# Patient Record
Sex: Male | Born: 1985 | Race: Black or African American | Hispanic: No | Marital: Single | Smoking: Current every day smoker
Health system: Southern US, Community
[De-identification: ages and names within clinical notes are randomized; demographics above are authoritative.]

## PROBLEM LIST (undated history)

## (undated) DIAGNOSIS — F32A Depression, unspecified: Secondary | ICD-10-CM

## (undated) DIAGNOSIS — F329 Major depressive disorder, single episode, unspecified: Secondary | ICD-10-CM

---

## 2014-10-15 ENCOUNTER — Encounter (HOSPITAL_COMMUNITY): Payer: Self-pay | Admitting: *Deleted

## 2014-10-15 ENCOUNTER — Emergency Department (HOSPITAL_COMMUNITY): Payer: Medicaid Other

## 2014-10-15 ENCOUNTER — Emergency Department (HOSPITAL_COMMUNITY)
Admission: EM | Admit: 2014-10-15 | Discharge: 2014-10-15 | Disposition: A | Discharge status: Home / Self Care | Payer: Medicaid Other | Attending: Emergency Medicine | Admitting: Emergency Medicine

## 2014-10-15 DIAGNOSIS — S299XXA Unspecified injury of thorax, initial encounter: Secondary | ICD-10-CM | POA: Diagnosis not present

## 2014-10-15 DIAGNOSIS — S79921A Unspecified injury of right thigh, initial encounter: Secondary | ICD-10-CM | POA: Diagnosis not present

## 2014-10-15 DIAGNOSIS — Z720 Tobacco use: Secondary | ICD-10-CM | POA: Diagnosis not present

## 2014-10-15 DIAGNOSIS — Y998 Other external cause status: Secondary | ICD-10-CM | POA: Insufficient documentation

## 2014-10-15 DIAGNOSIS — Z23 Encounter for immunization: Secondary | ICD-10-CM | POA: Insufficient documentation

## 2014-10-15 DIAGNOSIS — S79922A Unspecified injury of left thigh, initial encounter: Secondary | ICD-10-CM | POA: Diagnosis not present

## 2014-10-15 DIAGNOSIS — Y9389 Activity, other specified: Secondary | ICD-10-CM | POA: Insufficient documentation

## 2014-10-15 DIAGNOSIS — S80212A Abrasion, left knee, initial encounter: Secondary | ICD-10-CM | POA: Diagnosis not present

## 2014-10-15 DIAGNOSIS — S80211A Abrasion, right knee, initial encounter: Secondary | ICD-10-CM | POA: Diagnosis not present

## 2014-10-15 DIAGNOSIS — Y92410 Unspecified street and highway as the place of occurrence of the external cause: Secondary | ICD-10-CM | POA: Diagnosis not present

## 2014-10-15 DIAGNOSIS — S01511A Laceration without foreign body of lip, initial encounter: Secondary | ICD-10-CM | POA: Diagnosis not present

## 2014-10-15 LAB — I-STAT CHEM 8, ED
BUN: 16 mg/dL (ref 6–23)
CREATININE: 0.9 mg/dL (ref 0.50–1.35)
Calcium, Ion: 1.16 mmol/L (ref 1.12–1.23)
Chloride: 101 mmol/L (ref 96–112)
GLUCOSE: 97 mg/dL (ref 70–99)
HEMATOCRIT: 44 % (ref 39.0–52.0)
Hemoglobin: 15 g/dL (ref 13.0–17.0)
Potassium: 4.4 mmol/L (ref 3.5–5.1)
Sodium: 142 mmol/L (ref 135–145)
TCO2: 26 mmol/L (ref 0–100)

## 2014-10-15 LAB — COMPREHENSIVE METABOLIC PANEL
ALBUMIN: 3.6 g/dL (ref 3.5–5.2)
ALT: 13 U/L (ref 0–53)
ANION GAP: 9 (ref 5–15)
AST: 34 U/L (ref 0–37)
Alkaline Phosphatase: 47 U/L (ref 39–117)
BILIRUBIN TOTAL: 1 mg/dL (ref 0.3–1.2)
BUN: 13 mg/dL (ref 6–23)
CO2: 25 mmol/L (ref 19–32)
Calcium: 8.8 mg/dL (ref 8.4–10.5)
Chloride: 107 mmol/L (ref 96–112)
Creatinine, Ser: 0.88 mg/dL (ref 0.50–1.35)
GFR calc Af Amer: >90 mL/min (ref >90)
GFR calc non Af Amer: >90 mL/min (ref >90)
GLUCOSE: 98 mg/dL (ref 70–99)
POTASSIUM: 4.5 mmol/L (ref 3.5–5.1)
Sodium: 141 mmol/L (ref 135–145)
TOTAL PROTEIN: 5.7 g/dL — AB (ref 6.0–8.3)

## 2014-10-15 LAB — TYPE AND SCREEN
ABO/RH(D): A POS
Antibody Screen: NEGATIVE

## 2014-10-15 LAB — CBC
HEMATOCRIT: 40.3 % (ref 39.0–52.0)
Hemoglobin: 13.5 g/dL (ref 13.0–17.0)
MCH: 30 pg (ref 26.0–34.0)
MCHC: 33.5 g/dL (ref 30.0–36.0)
MCV: 89.6 fL (ref 78.0–100.0)
Platelets: 207 10*3/uL (ref 150–400)
RBC: 4.5 MIL/uL (ref 4.22–5.81)
RDW: 14.4 % (ref 11.5–15.5)
WBC: 4.8 10*3/uL (ref 4.0–10.5)

## 2014-10-15 LAB — ABO/RH: ABO/RH(D): A POS

## 2014-10-15 LAB — ETHANOL

## 2014-10-15 LAB — PROTIME-INR
INR: 0.97 (ref 0.00–1.49)
Prothrombin Time: 13 seconds (ref 11.6–15.2)

## 2014-10-15 MED ORDER — FENTANYL CITRATE 0.05 MG/ML IJ SOLN
50.0000 ug | Freq: Once | INTRAMUSCULAR | Status: AC
  Administered 2014-10-15: 50 ug via INTRAVENOUS

## 2014-10-15 MED ORDER — FENTANYL CITRATE 0.05 MG/ML IJ SOLN
INTRAMUSCULAR | Status: AC
  Filled 2014-10-15: qty 2

## 2014-10-15 MED ORDER — TETANUS-DIPHTH-ACELL PERTUSSIS 5-2.5-18.5 LF-MCG/0.5 IM SUSP
INTRAMUSCULAR | Status: AC
  Administered 2014-10-15: 0.5 mL via INTRAMUSCULAR
  Filled 2014-10-15: qty 0.5

## 2014-10-15 MED ORDER — TETANUS-DIPHTHERIA TOXOIDS TD 5-2 LFU IM INJ: 0.5000 mL | INJECTION | Freq: Once | INTRAMUSCULAR | Status: DC

## 2014-10-15 MED ORDER — IOHEXOL 300 MG/ML  SOLN
100.0000 mL | Freq: Once | INTRAMUSCULAR | Status: AC | PRN
  Administered 2014-10-15: 100 mL via INTRAVENOUS

## 2014-10-15 MED ORDER — HYDROCODONE-ACETAMINOPHEN 5-325 MG PO TABS: 1.0000 | ORAL_TABLET | ORAL | Status: DC | PRN

## 2014-10-15 NOTE — Discharge Instructions (Signed)

## 2014-10-15 NOTE — ED Notes (Signed)
Pt given paper scrubs for transport home

## 2014-10-15 NOTE — ED Notes (Signed)
Pt given paper scrubs. 
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
## 2014-10-15 NOTE — ED Notes (Signed)
Pt tolerating liquids well, pt being driven home by his mother
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
## 2014-10-16 LAB — CDS SEROLOGY

## 2015-02-03 ENCOUNTER — Encounter (HOSPITAL_COMMUNITY): Payer: Self-pay | Admitting: Emergency Medicine

## 2015-02-03 ENCOUNTER — Emergency Department (HOSPITAL_COMMUNITY)
Admission: EM | Admit: 2015-02-03 | Discharge: 2015-02-04 | Disposition: A | Discharge status: Other Acute Inpatient Hospital | Payer: Medicaid Other | Attending: Emergency Medicine | Admitting: Emergency Medicine

## 2015-02-03 DIAGNOSIS — F419 Anxiety disorder, unspecified: Secondary | ICD-10-CM | POA: Insufficient documentation

## 2015-02-03 DIAGNOSIS — Z720 Tobacco use: Secondary | ICD-10-CM | POA: Insufficient documentation

## 2015-02-03 DIAGNOSIS — R443 Hallucinations, unspecified: Secondary | ICD-10-CM

## 2015-02-03 DIAGNOSIS — Z008 Encounter for other general examination: Secondary | ICD-10-CM | POA: Diagnosis present

## 2015-02-03 DIAGNOSIS — F1210 Cannabis abuse, uncomplicated: Secondary | ICD-10-CM | POA: Insufficient documentation

## 2015-02-03 HISTORY — DX: Major depressive disorder, single episode, unspecified: F32.9

## 2015-02-03 HISTORY — DX: Depression, unspecified: F32.A

## 2015-02-03 LAB — COMPREHENSIVE METABOLIC PANEL
ALT: 16 U/L — AB (ref 17–63)
ANION GAP: 5 (ref 5–15)
AST: 21 U/L (ref 15–41)
Albumin: 3.8 g/dL (ref 3.5–5.0)
Alkaline Phosphatase: 51 U/L (ref 38–126)
BUN: 8 mg/dL (ref 6–20)
CO2: 30 mmol/L (ref 22–32)
Calcium: 9.5 mg/dL (ref 8.9–10.3)
Chloride: 109 mmol/L (ref 101–111)
Creatinine, Ser: 0.98 mg/dL (ref 0.61–1.24)
GFR calc Af Amer: >60 mL/min (ref >60)
GFR calc non Af Amer: >60 mL/min (ref >60)
Glucose, Bld: 109 mg/dL — ABNORMAL HIGH (ref 65–99)
POTASSIUM: 4 mmol/L (ref 3.5–5.1)
Sodium: 144 mmol/L (ref 135–145)
Total Bilirubin: 0.4 mg/dL (ref 0.3–1.2)
Total Protein: 6.3 g/dL — ABNORMAL LOW (ref 6.5–8.1)

## 2015-02-03 LAB — URINALYSIS, ROUTINE W REFLEX MICROSCOPIC
BILIRUBIN URINE: NEGATIVE
Glucose, UA: NEGATIVE mg/dL
HGB URINE DIPSTICK: NEGATIVE
Ketones, ur: NEGATIVE mg/dL
Leukocytes, UA: NEGATIVE
NITRITE: NEGATIVE
PROTEIN: NEGATIVE mg/dL
Specific Gravity, Urine: 1.021 (ref 1.005–1.030)
Urobilinogen, UA: 0.2 mg/dL (ref 0.0–1.0)
pH: 7.5 (ref 5.0–8.0)

## 2015-02-03 LAB — RAPID URINE DRUG SCREEN, HOSP PERFORMED
AMPHETAMINES: NOT DETECTED
Barbiturates: NOT DETECTED
Benzodiazepines: NOT DETECTED
COCAINE: NOT DETECTED
Opiates: NOT DETECTED
TETRAHYDROCANNABINOL: POSITIVE — AB

## 2015-02-03 LAB — CBC
HCT: 40.2 % (ref 39.0–52.0)
HEMOGLOBIN: 13.4 g/dL (ref 13.0–17.0)
MCH: 30.2 pg (ref 26.0–34.0)
MCHC: 33.3 g/dL (ref 30.0–36.0)
MCV: 90.5 fL (ref 78.0–100.0)
Platelets: 201 10*3/uL (ref 150–400)
RBC: 4.44 MIL/uL (ref 4.22–5.81)
RDW: 13.6 % (ref 11.5–15.5)
WBC: 5.6 10*3/uL (ref 4.0–10.5)

## 2015-02-03 LAB — ETHANOL: Alcohol, Ethyl (B): 5 mg/dL — ABNORMAL HIGH (ref <5)

## 2015-02-03 MED ORDER — ACETAMINOPHEN 325 MG PO TABS
650.0000 mg | ORAL_TABLET | ORAL | Status: DC | PRN
Start: 2015-02-03 — End: 2015-02-04

## 2015-02-03 MED ORDER — ALUM & MAG HYDROXIDE-SIMETH 200-200-20 MG/5ML PO SUSP: 30.0000 mL | ORAL | Status: DC | PRN

## 2015-02-03 MED ORDER — IBUPROFEN 200 MG PO TABS: 600.0000 mg | ORAL_TABLET | Freq: Three times a day (TID) | ORAL | Status: DC | PRN

## 2015-02-03 MED ORDER — LORAZEPAM 1 MG PO TABS: 1.0000 mg | ORAL_TABLET | Freq: Three times a day (TID) | ORAL | Status: DC | PRN

## 2015-02-03 MED ORDER — ZOLPIDEM TARTRATE 5 MG PO TABS: 5.0000 mg | ORAL_TABLET | Freq: Every evening | ORAL | Status: DC | PRN

## 2015-02-03 MED ORDER — ONDANSETRON HCL 4 MG PO TABS: 4.0000 mg | ORAL_TABLET | Freq: Three times a day (TID) | ORAL | Status: DC | PRN

## 2015-02-03 NOTE — ED Provider Notes (Signed)
CSN: [id number]     Arrival date & time 02/03/15  1316 History   First MD Initiated Contact with Patient 02/03/15 1600     Chief Complaint  Patient presents with  . Homicidal  . Psychiatric Evaluation     (Consider location/radiation/quality/duration/timing/severity/associated sxs/prior Treatment) HPI patient presents with concern of new auditory hallucinations, homicidal ideation. He acknowledges a history of anxiety, states that in the past he has had similar auditory hallucination, but has not heard voices in a long time. Over the past days, the patient's voices have become dominant, telling him to perform both inconsequential, and violent actions. He denies medical problems, physical pain, any discomfort. He takes no medication currently. Has previously seen a psychiatrist, but not currently.      History reviewed. No pertinent past medical history. History reviewed. No pertinent past surgical history. No family history on file. History  Substance Use Topics  . Smoking status: Current Every Day Smoker  . Smokeless tobacco: Not on file  . Alcohol Use: No    Review of Systems  Constitutional:       Per HPI, otherwise negative  HENT:       Per HPI, otherwise negative  Respiratory:       Per HPI, otherwise negative  Cardiovascular:       Per HPI, otherwise negative  Gastrointestinal: Negative for vomiting.  Endocrine:       Negative aside from HPI  Genitourinary:       Neg aside from HPI   Musculoskeletal:       Per HPI, otherwise negative  Skin: Negative.   Neurological: Negative for syncope.  Psychiatric/Behavioral: Positive for hallucinations, sleep disturbance and dysphoric mood. The patient is nervous/anxious and is hyperactive.       Allergies  Review of patient's allergies indicates no known allergies.  Home Medications   Prior to Admission medications   Medication Sig Start Date End Date Taking? Authorizing Provider  HYDROcodone-acetaminophen  (NORCO/VICODIN) 5-325 MG per tablet Take 1 tablet by mouth every 4 (four) hours as needed. 10/15/14   Silas Flood, MD   BP 116/67 mmHg  Pulse 102  Temp(Src) 98.5 F (36.9 C) (Oral)  Resp 20  SpO2 98% Physical Exam  Constitutional: He is oriented to person, place, and time. He appears well-developed. No distress.  HENT:  Head: Normocephalic and atraumatic.  Eyes: Conjunctivae and EOM are normal.  Cardiovascular: Normal rate and regular rhythm.   Pulmonary/Chest: Effort normal. No stridor. No respiratory distress.  Abdominal: He exhibits no distension.  Musculoskeletal: He exhibits no edema.  Neurological: He is alert and oriented to person, place, and time.  Skin: Skin is warm and dry.  Psychiatric: His mood appears anxious. He expresses homicidal ideation. He expresses no suicidal plans and no homicidal plans.  Nursing note and vitals reviewed.   ED Course  Procedures (including critical care time)   I saw the ECG, relevant labs and studies - I agree with the interpretation.   MDM  Patient presents with concern of auditory hallucinations, homicidal ideation. Patient is awake, alert, afebrile, in no distress. Patient's labs unremarkable, patient cleared for psychiatric evaluation.  Gerhard Munch, MD 02/04/15 Jacinta Shoe
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
## 2015-02-03 NOTE — Progress Notes (Addendum)
Patient has been referred to the following facilities for IP psych treatment: [facility], [facility], [facility], [facility], and [facility].  At capacity: [facility]   CSW will continue to seek placement.  Melbourne Abts, LCSWA Disposition staff 02/03/2015 9:01 PM
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
## 2015-02-03 NOTE — ED Notes (Signed)
Staffing made aware states sitter is on the way.

## 2015-02-03 NOTE — ED Notes (Signed)
Sitter at bedside.

## 2015-02-03 NOTE — ED Notes (Signed)
TTS being performed.  

## 2015-02-04 ENCOUNTER — Encounter (HOSPITAL_COMMUNITY): Payer: Self-pay | Admitting: Behavioral Health

## 2015-02-04 ENCOUNTER — Inpatient Hospital Stay (HOSPITAL_COMMUNITY)
Admission: AD | Admit: 2015-02-04 | Discharge: 2015-02-10 | DRG: 885 | Disposition: A | Discharge status: Home / Self Care | Payer: Medicaid Other | Source: Intra-hospital | Attending: Psychiatry | Admitting: Psychiatry

## 2015-02-04 DIAGNOSIS — G4700 Insomnia, unspecified: Secondary | ICD-10-CM | POA: Diagnosis present

## 2015-02-04 DIAGNOSIS — F333 Major depressive disorder, recurrent, severe with psychotic symptoms: Secondary | ICD-10-CM | POA: Diagnosis present

## 2015-02-04 DIAGNOSIS — F17210 Nicotine dependence, cigarettes, uncomplicated: Secondary | ICD-10-CM | POA: Diagnosis present

## 2015-02-04 DIAGNOSIS — R45851 Suicidal ideations: Secondary | ICD-10-CM | POA: Diagnosis present

## 2015-02-04 MED ORDER — PNEUMOCOCCAL VAC POLYVALENT 25 MCG/0.5ML IJ INJ
0.5000 mL | INJECTION | INTRAMUSCULAR | Status: AC
  Administered 2015-02-05: 0.5 mL via INTRAMUSCULAR

## 2015-02-04 MED ORDER — ACETAMINOPHEN 325 MG PO TABS: 650.0000 mg | ORAL_TABLET | Freq: Four times a day (QID) | ORAL | Status: DC | PRN

## 2015-02-04 MED ORDER — TRAZODONE HCL 50 MG PO TABS
50.0000 mg | ORAL_TABLET | Freq: Every evening | ORAL | Status: DC | PRN
  Administered 2015-02-05 – 2015-02-09 (×5): 50 mg via ORAL
  Filled 2015-02-04: qty 1
  Filled 2015-02-04: qty 14
  Filled 2015-02-04 (×4): qty 1

## 2015-02-04 MED ORDER — ALUM & MAG HYDROXIDE-SIMETH 200-200-20 MG/5ML PO SUSP: 30.0000 mL | ORAL | Status: DC | PRN

## 2015-02-04 MED ORDER — MAGNESIUM HYDROXIDE 400 MG/5ML PO SUSP: 30.0000 mL | Freq: Every day | ORAL | Status: DC | PRN

## 2015-02-04 MED ORDER — OLANZAPINE 5 MG PO TBDP: 5.0000 mg | ORAL_TABLET | Freq: Three times a day (TID) | ORAL | Status: DC | PRN

## 2015-02-04 MED ORDER — HYDROXYZINE HCL 25 MG PO TABS
25.0000 mg | ORAL_TABLET | Freq: Four times a day (QID) | ORAL | Status: DC | PRN
  Administered 2015-02-04 – 2015-02-06 (×2): 25 mg via ORAL
  Filled 2015-02-04 (×2): qty 1

## 2015-02-04 MED ORDER — FLUOXETINE HCL 20 MG PO CAPS
20.0000 mg | ORAL_CAPSULE | Freq: Every day | ORAL | Status: DC
  Administered 2015-02-04 – 2015-02-10 (×7): 20 mg via ORAL
  Filled 2015-02-04 (×10): qty 1

## 2015-02-04 MED ORDER — RISPERIDONE 1 MG PO TABS
1.0000 mg | ORAL_TABLET | Freq: Two times a day (BID) | ORAL | Status: DC
  Administered 2015-02-04 – 2015-02-07 (×6): 1 mg via ORAL
  Filled 2015-02-04 (×9): qty 1

## 2015-02-04 MED ORDER — NICOTINE 21 MG/24HR TD PT24
21.0000 mg | MEDICATED_PATCH | Freq: Every day | TRANSDERMAL | Status: DC
  Filled 2015-02-04 (×2): qty 1

## 2015-02-04 MED ORDER — NICOTINE 21 MG/24HR TD PT24
21.0000 mg | MEDICATED_PATCH | Freq: Every day | TRANSDERMAL | Status: DC
  Administered 2015-02-04 – 2015-02-10 (×7): 21 mg via TRANSDERMAL
  Filled 2015-02-04 (×10): qty 1

## 2015-02-04 NOTE — Progress Notes (Signed)
EKG complete, showed to MD, and placed on front of chart.

## 2015-02-04 NOTE — ED Notes (Signed)
Pt. Used one of his 5 min phone calls to call his work and tell them he will not be there

## 2015-02-04 NOTE — Tx Team (Addendum)
Initial Interdisciplinary Treatment Plan   PATIENT STRESSORS: Medication change or noncompliance   PATIENT STRENGTHS: Ability for insight Average or above average intelligence Motivation for treatment/growth   PROBLEM LIST: Problem List/Patient Goals Date to be addressed Date deferred Reason deferred Estimated date of resolution  "Auditory  Hallucinations" 02/04/15     "Anxiety"  02/04/15     "Learn to interact with others" 02/04/15                                          DISCHARGE CRITERIA:  Ability to meet basic life and health needs Adequate post-discharge living arrangements Improved stabilization in mood, thinking, and/or behavior Withdrawal symptoms are absent or subacute and managed without 24-hour nursing intervention  PRELIMINARY DISCHARGE PLAN: Attend aftercare/continuing care group Attend PHP/IOP  PATIENT/FAMIILY INVOLVEMENT: This treatment plan has been presented to and reviewed with the patient, Joe Griffin, and/or family member.  The patient and family have been given the opportunity to ask questions and make suggestions.  Joe Griffin L 02/04/2015, 1:23 PM

## 2015-02-04 NOTE — Progress Notes (Signed)
Adult Psychoeducational Group Note  Date:  02/04/2015 Time:  11:43 PM  Group Topic/Focus:  Wrap-Up Group:   The focus of this group is to help patients review their daily goal of treatment and discuss progress on daily workbooks.  Participation Level:  Active  Participation Quality:  Appropriate and Attentive  Affect:  Appropriate  Cognitive:  Appropriate  Insight: Appropriate  Engagement in Group:  Engaged  Modes of Intervention:  Discussion and Education  Additional Comments:  Pt rated his day at an 8 out of 10. Pt shared that it was his first day here and staff has been really helpful to him. Pt stated he really misses his family and doesn't want to hurt anyone he cares about.   Malachy Moan 02/04/2015, 11:43 PM

## 2015-02-04 NOTE — Progress Notes (Signed)
Patient presents in the dayroom. Increased anxiety noted, taping feet. He reports he is in the hospital because he "got too stressed." He reports that he was working two jobs, started hearing voices at night and seeing things that weren't there. "I could not take it any more, I have been off my medication for a year." He denies SI. He denies HI at the time, but reports "I did want to hurt someone who I thought did me wrong, but scared to get in trouble." He denies physical pain. Will continue to monitor on special checks q 15 mins for safety.

## 2015-02-04 NOTE — ED Notes (Signed)
Breakfast tray received.

## 2015-02-04 NOTE — Progress Notes (Signed)
Pt accepted to [facility] bed 504-2 per Julieanne Cotton to Dr. Elna Breslow. Admission is voluntary. Pt can be transported when ready.  Spoke with [facility] RN re: pt's placement.   Ilean Skill, MSW, LCSWA Clinical Social Work, Disposition  02/04/2015 [phone]

## 2015-02-04 NOTE — Progress Notes (Signed)
D   Pt has been isolative and resting in bed   He endorses A/V hallucinations and some depression with anxiety   He is not interacting with others at this point but he has been cooperative A   Verbal support given   Medications offered and educated on same   Q 15 min checks R   Pt safe at present

## 2015-02-05 ENCOUNTER — Encounter (HOSPITAL_COMMUNITY): Payer: Self-pay | Admitting: Psychiatry

## 2015-02-05 MED ORDER — ENSURE ENLIVE PO LIQD
237.0000 mL | Freq: Every morning | ORAL | Status: DC
  Administered 2015-02-05 – 2015-02-10 (×5): 237 mL via ORAL

## 2015-02-05 MED ORDER — BENZTROPINE MESYLATE 0.5 MG PO TABS
0.5000 mg | ORAL_TABLET | Freq: Two times a day (BID) | ORAL | Status: DC
  Administered 2015-02-05 – 2015-02-10 (×10): 0.5 mg via ORAL
  Filled 2015-02-05 (×18): qty 1

## 2015-02-05 NOTE — Progress Notes (Signed)
NUTRITION ASSESSMENT  Pt identified as at risk on the Malnutrition Screen Tool  INTERVENTION: 1. Educated patient on the importance of nutrition and encouraged intake of food and beverages. 2. Discussed weight goals. 3. Supplements: will order Ensure Enlive once/day, this supplement provides 350 kcal and 20 grams of protein   NUTRITION DIAGNOSIS: Unintentional weight loss related to sub-optimal intake as evidenced by pt report.   Goal: Pt to meet >/= 90% of their estimated nutrition needs.  Monitor:  PO intake  Assessment:  Pt seen for MST. Appetite decreased PTA and weight hx reveals 13 lb weight loss (7% body weight) in 3.5 months which is not significant for time frame. Will order Ensure Enlive once/day to supplement.  29 y.o. male  Height: Ht Readings from Last 1 Encounters:  02/04/15 6' (1.829 m)    Weight: Wt Readings from Last 1 Encounters:  02/04/15 165 lb (74.844 kg)    Weight Hx: Wt Readings from Last 10 Encounters:  02/04/15 165 lb (74.844 kg)  10/15/14 78 lb (35.381 kg)    BMI:  Body mass index is 22.37 kg/(m^2). Pt meets criteria for normal weight based on current BMI.  Estimated Nutritional Needs: Kcal: 25-30 kcal/kg Protein: > 1 gram protein/kg Fluid: 1 ml/kcal  Diet Order: Diet regular Room service appropriate?: Yes; Fluid consistency:: Thin Pt is also offered choice of unit snacks mid-morning and mid-afternoon.  Pt is eating as desired.   Lab results and medications reviewed.      Trenton Gammon, RD, LDN Inpatient Clinical Dietitian Pager # [phone] After hours/weekend pager # [phone]
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
## 2015-02-05 NOTE — Tx Team (Signed)
Interdisciplinary Treatment Plan Update (Adult)  Date:  02/05/2015   Time Reviewed:  10:48 AM   Progress in Treatment: Attending groups: Yes. Participating in groups:  Yes. Taking medication as prescribed:  Yes. Tolerating medication:  Yes. Family/Significant othe contact made:  No Patient understands diagnosis:  Yes  As evidenced by seeking help with psychosis, depression Discussing patient identified problems/goals with staff:  Yes, see initial care plan. Medical problems stabilized or resolved:  Yes. Denies suicidal/homicidal ideation: Yes. Issues/concerns per patient self-inventory:  No. Other:  New problem(s) identified:  Discharge Plan or Barriers:  Return home, follow up outpt  Reason for Continuation of Hospitalization: Depression Hallucinations Medication stabilization  Comments:  Joe Griffin is an 29 y.o. male who presents to [facility] for evaluation of depression and AVH. He reports a history of both and states he was hospitalized at [facility] in 2012 and has not had psychiatric medication or treatment since moving to [place] in may of last year. He reports he's had some auditory hallucinations in the past, but that he could only hear them when he was silent. Now they have gotten to the point where he cannot shut them out. He states that he's only slept 3 hours in the last two days and that he's lost around 14lbs. He reports feeling depressed, anxious, irritable, tired, and endorses isolating behavior, anhedonia, feelings of worthlessness, and increased vegetative symptoms. He is calm, cooperative, and alert and oriented x 4.  Risperdal, Prozac trial  Estimated length of stay: 3-5 days  New goal(s):  Review of initial/current patient goals per problem list:   Review of initial/current patient goals per problem list:  1. Goal(s): Patient will participate in aftercare plan   Met: Yes   Target date: 3-5 days post admission date   As evidenced by: Patient  will participate within aftercare plan AEB aftercare provider and housing plan at discharge being identified.  02/05/2015: Pt plans to return home, follow up outpt.     3. Goal(s): Patient will demonstrate decreased signs and symptoms of anxiety.   Met: No   Target date: 3-5 days post admission date   As evidenced by: Patient will utilize self rating of anxiety at 3 or below and demonstrated decreased signs of anxiety, or be deemed stable for discharge by MD  02/05/2015: Pt rates anxiety at a 5 today.    5. Goal(s): Patient will demonstrate decreased signs of psychosis  * Met: No  * Target date: 3-5 days post admission date  * As evidenced by: Patient will demonstrate decreased frequency of AVH or return to baseline function  02/05/2015: While compliant with meds here, pt has not taken meds for about a year.  Was c/o both AH and VH at admission.       Attendees: Patient:  02/05/2015 10:48 AM   Family:   02/05/2015 10:48 AM   Physician:  Neita Garnet, MD 02/05/2015 10:48 AM   Nursing:   Festus Aloe, RN 02/05/2015 10:48 AM   CSW:    Roque Lias, LCSW   02/05/2015 10:48 AM   Other:  02/05/2015 10:48 AM   Other:   02/05/2015 10:48 AM   Other:  Lars Pinks, Nurse CM 02/05/2015 10:48 AM   Other:  Lucinda Dell, [facility] 02/05/2015 10:48 AM   Other:  Norberto Sorenson, [place]  02/05/2015 10:48 AM   Other:  02/05/2015 10:48 AM   Other:  02/05/2015 10:48 AM   Other:  02/05/2015 10:48 AM   Other:  02/05/2015 10:48 AM  Other:  02/05/2015 10:48 AM   Other:   02/05/2015 10:48 AM    Scribe for Treatment Team:   Trish Mage, 02/05/2015 10:48 AM
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
## 2015-02-05 NOTE — Progress Notes (Signed)
D) Pt has been animated and pleasant. Positive for groups and activities with minimal prompting. Pt is active in the milieu and interacting appropriately with peers and staff. At times affect can be a bit incongruent. Pt shared that he has been working two jobs, and going to school for Counsellor. 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 Pt positive for auditory hallucinations when still and unit quiet he says. Camille says when its noisy he can tune them out. A) Level 3 obs for safety, support and encouragement provided. Med ed reinforced. pneumonia vaccine given per MD order. R) receptive.

## 2015-02-05 NOTE — [facility] Group Notes (Signed)
[facility] LCSW Aftercare Discharge Planning Group Note   02/05/2015 11:01 AM  Participation Quality:  Engaged  Mood/Affect:  Appropriate  Depression Rating:  denies  Anxiety Rating:  5  Thoughts of Suicide:  No Will you contract for safety?   NA  Current AVH:  "Not right now.  But it usually doesn't bother me when I am around other people."  Plan for Discharge/Comments:  Good mood.  Happy that he is back on his meds again.  Slept well.  Appetite good.   Transportation Means: brother  Supports: brother  [place], Baldo Daub
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
## 2015-02-05 NOTE — [facility] Group Notes (Signed)
[facility] LCSW Group Therapy  02/05/2015 3:29 PM   Type of Therapy:  Group Therapy  Participation Level:  Active  Participation Quality:  Attentive  Affect:  Appropriate  Cognitive:  Appropriate  Insight:  Improving  Engagement in Therapy:  Engaged  Modes of Intervention:  Clarification, Education, Exploration and Socialization  Summary of Progress/Problems: Today's group focused on relapse prevention.  We defined the term, and then brainstormed on ways to prevent relapse. Rafi was engaged throughout.  No signs nor symptoms of psychosis.  Able to attend to subject, contribute appropriately and is goal directed.  Good insight.  Gave positive feedback and encouragement to others.  Daryel Gerald B 02/05/2015 , 3:29 PM

## 2015-02-06 DIAGNOSIS — F333 Major depressive disorder, recurrent, severe with psychotic symptoms: Principal | ICD-10-CM

## 2015-02-06 LAB — LIPID PANEL
Cholesterol: 214 mg/dL — ABNORMAL HIGH (ref 0–200)
HDL: 49 mg/dL (ref >40)
LDL CALC: 150 mg/dL — AB (ref 0–99)
TRIGLYCERIDES: 75 mg/dL (ref <150)
Total CHOL/HDL Ratio: 4.4 RATIO
VLDL: 15 mg/dL (ref 0–40)

## 2015-02-06 NOTE — Progress Notes (Signed)
Patient ID: Joe Griffin, male   DOB: 06/18/1986, 29 y.o.   MRN: [id number] D: Client visible on the unit in dayroom watching TV, smiling, interacts appropriately reports when asked about his day "so far so good, but nothing I couldn't handle"  Client reports using coping skill to deal with voices "distraction, talking, reading, try videos" Client reports admission was due to not taking medications "I was working to hard" Client reports admission was helpful "made me realize I need my medications" Client plans for discharge "get my medicine before I run out" A: Writer encouraged client to move forward with positive plans to stay on medications and report any side effects. Staff will monitor q35min for safety. R: Client is safe on the unit, attended karaoke and sang.

## 2015-02-06 NOTE — Progress Notes (Addendum)
[facility] MD Progress Note  02/06/2015 3:23 PM Joe Griffin  MRN:  [id number] Subjective:  Patient is a 29 year old male diagnosed with major depressive disorder recurrent severe with psychotic features  Patient reports that he's been sleeping excessively because of his medication, adds that he is no longer hearing voices. Patient however seems to be responding to internal stimuli, smiling inappropriately  Patient reports that he still is struggling with his thinking, has problems with anger, gets irritated easily. He states that he wants to get better. He has that he misses his grandfather, is sad about it but knows that his grandfather is in the better place. Patient states that he would like to be tested for STDs though he denies any symptoms such as itching, discharge or redness  Patient denies any side effects of the medications, any thoughts of hurting himself or others. Principal Problem: MDD (major depressive disorder), recurrent, severe, with psychosis Diagnosis:   Patient Active Problem List   Diagnosis Date Noted  . MDD (major depressive disorder), recurrent, severe, with psychosis [F33.3] 02/04/2015   Total Time spent with patient: 30 minutes   Past Medical History:  Past Medical History  Diagnosis Date  . Depression    History reviewed. No pertinent past surgical history. Family History: History reviewed. No pertinent family history. Social History:  History  Alcohol Use No     History  Drug Use Not on file    History   Social History  . Marital Status: Single    Spouse Name: N/A  . Number of Children: N/A  . Years of Education: N/A   Social History Main Topics  . Smoking status: Current Every Day Smoker  . Smokeless tobacco: Not on file  . Alcohol Use: No  . Drug Use: Not on file  . Sexual Activity: Not on file   Other Topics Concern  . None   Social History Narrative   Additional History:    Sleep: Fair  Appetite:  Fair   Assessment:    Musculoskeletal: Strength & Muscle Tone: within normal limits Gait & Station: normal Patient leans: N/A   Psychiatric Specialty Exam: Physical Exam  Review of Systems  Constitutional: Negative.  Negative for fever, chills, weight loss and malaise/fatigue.  HENT: Negative.  Negative for congestion, hearing loss and sore throat.   Eyes: Negative.  Negative for blurred vision and double vision.  Respiratory: Negative.  Negative for cough and hemoptysis.   Cardiovascular: Negative for chest pain and palpitations.  Gastrointestinal: Negative.  Negative for heartburn, nausea, vomiting, abdominal pain and constipation.  Genitourinary: Negative.  Negative for dysuria.  Musculoskeletal: Negative.  Negative for myalgias and falls.  Skin: Negative.  Negative for rash.  Neurological: Negative.  Negative for dizziness, seizures, loss of consciousness, weakness and headaches.  Endo/Heme/Allergies: Negative.  Negative for environmental allergies.  Psychiatric/Behavioral: Positive for depression and hallucinations. Negative for suicidal ideas, memory loss and substance abuse. The patient is not nervous/anxious and does not have insomnia.     Blood pressure 102/61, pulse 105, temperature 98.9 F (37.2 C), temperature source Oral, resp. rate 18, height 6' (1.829 m), weight 74.844 kg (165 lb).Body mass index is 22.37 kg/(m^2).  General Appearance: Casual and Disheveled  Eye Contact::  Fair  Speech:  Clear and Coherent and Normal Rate  Volume:  Decreased  Mood:  Depressed and Dysphoric  Affect:  Non-Congruent and Inappropriate  Thought Process:  Coherent and Disorganized  Orientation:  Full (Time, Place, and Person)  Thought Content:  Hallucinations: Auditory  Suicidal Thoughts:  No  Homicidal Thoughts:  No  Memory:  Immediate;   Fair Recent;   Fair Remote;   Fair  Judgement:  Poor  Insight:  Lacking  Psychomotor Activity:  Mannerisms  Concentration:  Poor  Recall:  Poor  Fund of  Knowledge:Fair  Language: Fair  Akathisia:  No  Handed:  Right  AIMS (if indicated):     Assets:  Desire for Improvement Social Support  ADL's:  Impaired  Cognition: Impaired,  Mild  Sleep:  Number of Hours: 5.25     Current Medications: Current Facility-Administered Medications  Medication Dose Route Frequency Provider Last Rate Last Dose  . acetaminophen (TYLENOL) tablet 650 mg  650 mg Oral Q6H PRN Thermon Leyland, NP      . alum & mag hydroxide-simeth (MAALOX/MYLANTA) 200-200-20 MG/5ML suspension 30 mL  30 mL Oral Q4H PRN Thermon Leyland, NP      . benztropine (COGENTIN) tablet 0.5 mg  0.5 mg Oral BID Beau Fanny, FNP   0.5 mg at 02/06/15 9604  . feeding supplement (ENSURE ENLIVE) (ENSURE ENLIVE) liquid 237 mL  237 mL Oral q morning - 10a Anderson [place], RD   237 mL at 02/05/15 1010  . FLUoxetine (PROZAC) capsule 20 mg  20 mg Oral Daily Sanjuana Kava, NP   20 mg at 02/06/15 0811  . hydrOXYzine (ATARAX/VISTARIL) tablet 25 mg  25 mg Oral Q6H PRN Thermon Leyland, NP   25 mg at 02/04/15 1836  . magnesium hydroxide (MILK OF MAGNESIA) suspension 30 mL  30 mL Oral Daily PRN Thermon Leyland, NP      . nicotine (NICODERM CQ - dosed in mg/24 hours) patch 21 mg  21 mg Transdermal Daily Craige Cotta, MD   21 mg at 02/06/15 5409  . OLANZapine zydis (ZYPREXA) disintegrating tablet 5 mg  5 mg Oral Q8H PRN Thermon Leyland, NP      . risperiDONE (RISPERDAL) tablet 1 mg  1 mg Oral BID Sanjuana Kava, NP   1 mg at 02/06/15 8119  . traZODone (DESYREL) tablet 50 mg  50 mg Oral QHS PRN Thermon Leyland, NP   50 mg at 02/05/15 2318    Lab Results:  Results for orders placed or performed during the hospital encounter of 02/04/15 (from the past 48 hour(s))  Lipid panel     Status: Abnormal   Collection Time: 02/06/15  6:33 AM  Result Value Ref Range   Cholesterol 214 (H) 0 - 200 mg/dL   Triglycerides 75 <147 mg/dL   HDL 49 >82 mg/dL   Total CHOL/HDL Ratio 4.4 RATIO   VLDL 15 0 - 40 mg/dL   LDL  Cholesterol 956 (H) 0 - 99 mg/dL    Comment:        Total Cholesterol/HDL:CHD Risk Coronary Heart Disease Risk Table                     Men   Women  1/2 Average Risk   3.4   3.3  Average Risk       5.0   4.4  2 X Average Risk   9.6   7.1  3 X Average Risk  23.4   11.0        Use the calculated Patient Ratio above and the CHD Risk Table to determine the patient's CHD Risk.        ATP III CLASSIFICATION (LDL):  <100  mg/dL   Optimal  696-295  mg/dL   Near or Above                    Optimal  130-159  mg/dL   Borderline  284-132  mg/dL   High  >440     mg/dL   Very High Performed at [facility]     Physical Findings: AIMS: Facial and Oral Movements Muscles of Facial Expression: None, normal Lips and Perioral Area: None, normal Jaw: None, normal Tongue: None, normal,Extremity Movements Upper (arms, wrists, hands, fingers): None, normal Lower (legs, knees, ankles, toes): None, normal, Trunk Movements Neck, shoulders, hips: None, normal, Overall Severity Severity of abnormal movements (highest score from questions above): None, normal Incapacitation due to abnormal movements: None, normal Patient's awareness of abnormal movements (rate only patient's report): No Awareness, Dental Status Current problems with teeth and/or dentures?: No Does patient usually wear dentures?: No  CIWA:    COWS:     Treatment Plan Summary: Daily contact with patient to assess and evaluate symptoms and progress in treatment and Medication management Continue Prozac 20 mg daily for major depressive disorder Continue risperidone 1 mg twice daily for psychosis and also to help with the depression Continue Cogentin 0.5 mg twice daily for EPS Continue nicotine patch for smoking cessation. Discussed smoking cessation in length with patient and patient states that he's not willing to stop smoking and does not want any treatment for it Continue Vistaril 25 mg every 6 hours when necessary  anxiety or agitation Continue trazodone 50 mg at bedtime as needed for sleep Continue to participate in groups and therapeutic milieu       Sorah Falkenstein 02/06/2015, 3:23 PM

## 2015-02-06 NOTE — Plan of Care (Signed)
Problem: Alteration in thought process Goal: STG-Patient does not respond to command hallucinations Outcome: Progressing Pt alert and oriented to self, place, time and situation. No response to internal stimuli noted thus far this shift. Pt able to communicate logically, cooperative with unit routines and remains compliant with medications as prescribed.

## 2015-02-06 NOTE — Progress Notes (Signed)
D: Pt visible in milieu for majority of this shift. Pt presents with congruent affect and euthymic mood. Rated his depression 2/10, hopelessness 2/10 and anxiety 3/10 on self inventory sheet. Pt's goal for today is "getting out of her tomorrow" which he plans to attain by "going to groups, take my meds, speak with my doctor". Pt attended scheduled groups. Observed interacting well with peers and staff. Logical and coherent in thoughts.  A: Verbal education done on ordered medications prior to administration. 1:1 contact made with pt to conduct assessments and evaluate needs. Pt encouraged to voice concerns.Emotional support and availability offered. Safety maintained on Q 15 minutes checks without behavioral issues to note at this time.  R: Pt receptive to care. Denies SI, HI, AVH and pain when assessed. Cooperative with unit routines. Compliant with medications as prescribed. Denies adverse drug reactions. Remains safe on and off unit.

## 2015-02-06 NOTE — [facility] Group Notes (Signed)
[facility] Group Notes:  (Counselor/Nursing/MHT/Case Management/Adjunct)  02/06/2015 1:15PM  Type of Therapy:  Group Therapy  Participation Level:  Active  Participation Quality:  Appropriate  Affect:  Flat  Cognitive:  Oriented  Insight:  Improving  Engagement in Group:  Limited  Engagement in Therapy:  Limited  Modes of Intervention:  Discussion, Exploration and Socialization  Summary of Progress/Problems: The topic for group was balance in life.  Pt participated in the discussion about when their life was in balance and out of balance and how this feels.  Pt discussed ways to get back in balance and short term goals they can work on to get where they want to be. Engaged throughout.  States he is balanced today, and knows this because he is sleeping much better and is not feeling depressed.  When asked about this, stated his grandfather died 3 weeks ago, and that has been difficult.  We talked about grief and loss, and how that is an unbalancing event.  He identified exercise as a good way of getting back in balance, as well as taking care of personal hygiene and taking meds.   Daryel Gerald B 02/06/2015 4:07 PM

## 2015-02-06 NOTE — [facility] Group Notes (Addendum)
[facility] Group Notes:  (Nursing---Leisure and Lifestyle Changes)  Date:  02/06/2015  Time:  0930  Type of Therapy:  Psychoeducational Skills  Participation Level:  Active  Participation Quality:  Appropriate, Attentive, Sharing and Supportive  Affect:  Appropriate  Cognitive:  Alert and Oriented   Insight:  Good  Engagement in Group:  Engaged and Supportive  Modes of Intervention:  Discussion, Education, Orientation, Rapport Building and Support  Summary of Progress/Problems: Pt presents with calm mood and bright affect. Pt was engaged in group and stayed on topic.   Ouida Sills, Lincoln Maxin 02/06/2015, 0930

## 2015-02-06 NOTE — Progress Notes (Signed)
D: Patient alert and oriented x 4. Patient denies pain SI/HI/AVH.  A: Staff to monitor Q 15 mins for safety. Encouragement and support offered. Scheduled medications administered per orders. R: Patient remains safe on the unit. Patient attended group tonight. Patient visible on hte unit and interacting with peers. Patient taking administered medications.  

## 2015-02-07 LAB — HEMOGLOBIN A1C
HEMOGLOBIN A1C: 5.9 % — AB (ref 4.8–5.6)
Mean Plasma Glucose: 123 mg/dL

## 2015-02-07 LAB — RPR: RPR: NONREACTIVE

## 2015-02-07 LAB — GC/CHLAMYDIA PROBE AMP (~~LOC~~) NOT AT ARMC
Chlamydia: NEGATIVE
Neisseria Gonorrhea: NEGATIVE

## 2015-02-07 MED ORDER — LORAZEPAM 1 MG PO TABS: 1.0000 mg | ORAL_TABLET | Freq: Four times a day (QID) | ORAL | Status: DC | PRN

## 2015-02-07 MED ORDER — RISPERIDONE 2 MG PO TABS
2.0000 mg | ORAL_TABLET | Freq: Every day | ORAL | Status: DC
  Administered 2015-02-07 – 2015-02-08 (×2): 2 mg via ORAL
  Filled 2015-02-07 (×4): qty 1

## 2015-02-07 MED ORDER — RISPERIDONE 1 MG PO TABS
1.0000 mg | ORAL_TABLET | Freq: Every day | ORAL | Status: DC
  Administered 2015-02-08 – 2015-02-09 (×2): 1 mg via ORAL
  Filled 2015-02-07 (×4): qty 1

## 2015-02-07 NOTE — Progress Notes (Signed)
The focus of this group is to help patients review their daily goal of treatment and discuss progress on daily workbooks. Pt attended the evening group session and responded to all discussion prompts from the Writer. 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 "I feel so much better knowing I still have my job. I know I need to focus on me right now, but bills and rent don't just go away." Pt's only additional request from Nursing Staff this evening was to be allowed to shave, which he did following wrap-up. Pt's affect was appropriate and he volunteered several encouraging comments to his peers during group.

## 2015-02-07 NOTE — Progress Notes (Addendum)
Pt is very pleasant and cooperative. He appears to be responding to internal stimuli evident by him smiling all the time and laughing inappropriately. Pt wants to remain on his meds . He rates his depression a 0/10 and his anxiety a 2./10 today. Pt has been attending group. 3:30p_Pt tried to call Nap[place] with [employer]. The company that subcontracts is: [employer]-Pt contacted Noreene Larsson at [employer] to see if she could help him with his living situation. Her number is: [phone]. Pt is very friendly and told the writer he one day would like to meet a nice girl and have a family. He stated he always tries to take care of his younger siblings. Pt is in the dayroom with the other pts. He would like to leave so he can go make money and pay his apt rent.

## 2015-02-07 NOTE — Plan of Care (Signed)
Problem: Alteration in thought process Goal: LTG-Patient verbalizes understanding importance med regimen (Patient verbalizes understanding of importance of medication regimen and need to continue outpatient care.)  Outcome: Progressing Client reports importance of medications AEB admission has been helpful "made me realize I need my medicine" client has been compliant with medications.

## 2015-02-07 NOTE — Progress Notes (Signed)
Patient ID: Joe Griffin, male   DOB: [birth year], 29 y.o.   MRN: [id number] D: client visible on the unit, reports "so far so good" notes voices have decreased "I learned how to do other things to drown them out" client pleasant, smiling. A: Writer provided emotional support,encouraged client to continue using coping skills and being compliant with medications. Staff will monitor q1min for safety. R:Client is safe on the unit, attended group.

## 2015-02-07 NOTE — [facility] Group Notes (Signed)
[facility] LCSW Aftercare Discharge Planning Group Note   02/07/2015 1:17 PM  Participation Quality:  Engaged  Mood/Affect:  Flat  Depression Rating:  5  Anxiety Rating:  4  Thoughts of Suicide:  No Will you contract for safety?   NA  Current AVH:  Denies  "Not as long as I am distracted by other people"  Plan for Discharge/Comments:  Sleep good.  Appetite good.  OK with meds. Feels he is going in a good direction.  OK with staying through the weekend.  Transportation Means:   Supports:  Daryel Gerald B

## 2015-02-07 NOTE — Progress Notes (Addendum)
[facility] MD Progress Note  02/07/2015 2:17 PM Joe Griffin  MRN:  [id number] Subjective:  Patient is a 29 year old male diagnosed with major depressive disorder recurrent severe with psychotic features.  He reports being worried about his work and making the rent this week.  Discussed the importance of being stable before discharge.    Patient reports that he's been sleeping excessively because of his medication, adds that he is no longer hearing voices. Patient however does not seem to be responding to internal stimuli.  Patient denies any side effects of the medications, any thoughts of hurting himself or others.  Principal Problem: MDD (major depressive disorder), recurrent, severe, with psychosis Diagnosis:   Patient Active Problem List   Diagnosis Date Noted  . MDD (major depressive disorder), recurrent, severe, with psychosis [F33.3] 02/04/2015   Total Time spent with patient: 30 minutes   Past Medical History:  Past Medical History  Diagnosis Date  . Depression    History reviewed. No pertinent past surgical history. Family History: History reviewed. No pertinent family history. Social History:  History  Alcohol Use No     History  Drug Use Not on file    History   Social History  . Marital Status: Single    Spouse Name: N/A  . Number of Children: N/A  . Years of Education: N/A   Social History Main Topics  . Smoking status: Current Every Day Smoker  . Smokeless tobacco: Not on file  . Alcohol Use: No  . Drug Use: Not on file  . Sexual Activity: Not on file   Other Topics Concern  . None   Social History Narrative   Additional History:    Sleep: Fair  Appetite:  Fair   Assessment:   Musculoskeletal: Strength & Muscle Tone: within normal limits Gait & Station: normal Patient leans: N/A   Psychiatric Specialty Exam: Physical Exam  Vitals reviewed.   Review of Systems  Constitutional: Negative.  Negative for fever, chills, weight loss and  malaise/fatigue.  HENT: Negative.  Negative for congestion, hearing loss and sore throat.   Eyes: Negative.  Negative for blurred vision and double vision.  Respiratory: Negative.  Negative for cough and hemoptysis.   Cardiovascular: Negative for chest pain and palpitations.  Gastrointestinal: Negative.  Negative for heartburn, nausea, vomiting, abdominal pain and constipation.  Genitourinary: Negative.  Negative for dysuria.  Musculoskeletal: Negative.  Negative for myalgias and falls.  Skin: Negative.  Negative for rash.  Neurological: Negative.  Negative for dizziness, seizures, loss of consciousness, weakness and headaches.  Endo/Heme/Allergies: Negative.  Negative for environmental allergies.  Psychiatric/Behavioral: Positive for depression and hallucinations. Negative for suicidal ideas, memory loss and substance abuse. The patient is not nervous/anxious and does not have insomnia.     Blood pressure 106/76, pulse 104, temperature 98 F (36.7 C), temperature source Oral, resp. rate 16, height 6' (1.829 m), weight 74.844 kg (165 lb).Body mass index is 22.37 kg/(m^2).  General Appearance: Casual  Eye Contact::  Fair  Speech:  Clear and Coherent and Normal Rate  Volume:  Decreased  Mood:  Depressed and Dysphoric  Affect:  Non-Congruent and Inappropriate  Thought Process:  Coherent and Disorganized  Orientation:  Full (Time, Place, and Person)  Thought Content:  Hallucinations: Auditory  Suicidal Thoughts:  No  Homicidal Thoughts:  No  Memory:  Immediate;   Fair Recent;   Fair Remote;   Fair  Judgement:  Poor  Insight:  Lacking  Psychomotor Activity:  Mannerisms  Concentration:  Fair  Recall:  Jennelle Human of Knowledge:Fair  Language: Fair  Akathisia:  No  Handed:  Right  AIMS (if indicated):     Assets:  Desire for Improvement Social Support  ADL's:  Impaired  Cognition: Impaired,  Mild  Sleep:  Number of Hours: 6.25     Current Medications: Current  Facility-Administered Medications  Medication Dose Route Frequency Provider Last Rate Last Dose  . acetaminophen (TYLENOL) tablet 650 mg  650 mg Oral Q6H PRN Thermon Leyland, NP      . alum & mag hydroxide-simeth (MAALOX/MYLANTA) 200-200-20 MG/5ML suspension 30 mL  30 mL Oral Q4H PRN Thermon Leyland, NP      . benztropine (COGENTIN) tablet 0.5 mg  0.5 mg Oral BID Beau Fanny, FNP   0.5 mg at 02/07/15 0746  . feeding supplement (ENSURE ENLIVE) (ENSURE ENLIVE) liquid 237 mL  237 mL Oral q morning - 10a Anderson Malta [place], RD   237 mL at 02/07/15 1031  . FLUoxetine (PROZAC) capsule 20 mg  20 mg Oral Daily Sanjuana Kava, NP   20 mg at 02/07/15 0745  . hydrOXYzine (ATARAX/VISTARIL) tablet 25 mg  25 mg Oral Q6H PRN Thermon Leyland, NP   25 mg at 02/06/15 1545  . LORazepam (ATIVAN) tablet 1 mg  1 mg Oral Q6H PRN Adonis Brook, NP      . magnesium hydroxide (MILK OF MAGNESIA) suspension 30 mL  30 mL Oral Daily PRN Thermon Leyland, NP      . nicotine (NICODERM CQ - dosed in mg/24 hours) patch 21 mg  21 mg Transdermal Daily Craige Cotta, MD   21 mg at 02/07/15 0745  . OLANZapine zydis (ZYPREXA) disintegrating tablet 5 mg  5 mg Oral Q8H PRN Thermon Leyland, NP      . Melene Muller ON 02/08/2015] risperiDONE (RISPERDAL) tablet 1 mg  1 mg Oral QAC breakfast Adonis Brook, NP      . risperiDONE (RISPERDAL) tablet 2 mg  2 mg Oral QHS Adonis Brook, NP      . traZODone (DESYREL) tablet 50 mg  50 mg Oral QHS PRN Thermon Leyland, NP   50 mg at 02/06/15 2213    Lab Results:  Results for orders placed or performed during the hospital encounter of 02/04/15 (from the past 48 hour(s))  Lipid panel     Status: Abnormal   Collection Time: 02/06/15  6:33 AM  Result Value Ref Range   Cholesterol 214 (H) 0 - 200 mg/dL   Triglycerides 75 <161 mg/dL   HDL 49 >09 mg/dL   Total CHOL/HDL Ratio 4.4 RATIO   VLDL 15 0 - 40 mg/dL   LDL Cholesterol 604 (H) 0 - 99 mg/dL    Comment:        Total Cholesterol/HDL:CHD Risk Coronary Heart  Disease Risk Table                     Men   Women  1/2 Average Risk   3.4   3.3  Average Risk       5.0   4.4  2 X Average Risk   9.6   7.1  3 X Average Risk  23.4   11.0        Use the calculated Patient Ratio above and the CHD Risk Table to determine the patient's CHD Risk.        ATP III CLASSIFICATION (LDL):  <100  mg/dL   Optimal  161-096  mg/dL   Near or Above                    Optimal  130-159  mg/dL   Borderline  045-409  mg/dL   High  >811     mg/dL   Very High Performed at [facility]   Hemoglobin A1c     Status: Abnormal   Collection Time: 02/06/15  6:33 AM  Result Value Ref Range   Hgb A1c MFr Bld 5.9 (H) 4.8 - 5.6 %    Comment: (NOTE)         Pre-diabetes: 5.7 - 6.4         Diabetes: >6.4         Glycemic control for adults with diabetes: <7.0    Mean Plasma Glucose 123 mg/dL    Comment: (NOTE) Performed At: [facility] [address] [place], [place] [id number] Mila Homer MD OZ:[id number] Performed at [facility]     Physical Findings: AIMS: Facial and Oral Movements Muscles of Facial Expression: None, normal Lips and Perioral Area: None, normal Jaw: None, normal Tongue: None, normal,Extremity Movements Upper (arms, wrists, hands, fingers): None, normal Lower (legs, knees, ankles, toes): None, normal, Trunk Movements Neck, shoulders, hips: None, normal, Overall Severity Severity of abnormal movements (highest score from questions above): None, normal Incapacitation due to abnormal movements: None, normal Patient's awareness of abnormal movements (rate only patient's report): No Awareness, Dental Status Current problems with teeth and/or dentures?: No Does patient usually wear dentures?: No  CIWA:    COWS:     Treatment Plan Summary: Daily contact with patient to assess and evaluate symptoms and progress in treatment and Medication management Continue Prozac 20 mg daily for major depressive  disorder Changed Risperidone 1 mg am and 2 mg pm for psychosis and also to help with the depression.  On Sunday, plan to modify order to 3 mg QHS and plan for discharge on Monday. Continue Cogentin 0.5 mg twice daily for EPS Continue nicotine patch for smoking cessation. Discussed smoking cessation in length with patient and patient states that he's not willing to stop smoking and does not want any treatment for it Continue Vistaril 25 mg every 6 hours when necessary anxiety or agitation Continue trazodone 50 mg at bedtime as needed for sleep Continue to participate in groups and therapeutic milieu  Velna Hatchet May Agustin AGNP-BC 02/07/2015, 2:17 PM  Patient seen by me, evaluated by me in treatment plan formulated by me. Patient has been previously on 3 mg of risperidone at bedtime and was stable on it. To increase risperidone to 1 mg in the morning and 2 at at bedtime from today and if patient tolerates it well,  on Sunday to change risperidone to 3 mg at bedtime with possible discharge on Monday. Patient's RPR, GC and Chlamydia probe were negative Nelly Rout, MD

## 2015-02-07 NOTE — Progress Notes (Signed)
Pt attended karaoke group this evening.  

## 2015-02-07 NOTE — [facility] Group Notes (Signed)
[facility] LCSW Group Therapy  02/07/2015  1:05 PM  Type of Therapy:  Group therapy  Participation Level:  Active  Participation Quality:  Attentive  Affect:  Flat  Cognitive:  Oriented  Insight:  Limited  Engagement in Therapy:  Limited  Modes of Intervention:  Discussion, Socialization  Summary of Progress/Problems:  Chaplain was here to lead a group on themes of hope and courage.  "Hope is an expectation that things will be good.  I am hoping to graduate in a year."  Joe Griffin resonated with something another patient said about the hope they got from getting some cards.  He expressed appreciation for his family, who reached out to him and let  him know he is loved.  He is very future focused.  "I want to make a better life for myself, and for my future kids.  Joe Griffin 02/07/2015 1:20 PM

## 2015-02-08 LAB — HIV ANTIBODY (ROUTINE TESTING W REFLEX): HIV Screen 4th Generation wRfx: NONREACTIVE

## 2015-02-08 NOTE — Progress Notes (Signed)
Patient ID: Joe Griffin, male   DOB: 03/13/1986, 29 y.o.   MRN: [id number]   D: Pt has been appropriate on the unit today. Pt reported being upset this morning due to being in the room with another patient, patient reported that if he did not get moved he would throw hands with the other patient because he could not take it anymore. Pt was moved to another room, no other problems noted. Pt reported that his depression was a 2, his hopelessness was a 1, and his anxiety was a 2. Pt reported that his goal for today was to stay busy. Pt reported being negative SI/HI, no AH/VH noted. A: 15 min checks continued for patient safety. R: Pt safety maintained.

## 2015-02-08 NOTE — Progress Notes (Signed)
D: Pt has appropriate affect and pleasant mood.  Pt reports his goal today was "to get better, work on coping skills, reading, I've been writing a poem."  Pt reports he was "angry this morning but they helped me out."  Pt denies SI/HI, denies hallucinations, denies pain.  Pt has been visible in milieu interacting with peers and staff appropriately.  Pt attended evening group.   A: Introduced self to pt.  Met with pt 1:1 and provided support and encouragement; actively listened to pt.  Medications administered per order.  Praised pt for using positive coping skills.  PRN medication administered for sleep. R: Pt is compliant with medications.  Pt verbally contracts for safety.  Will continue to monitor and assess.

## 2015-02-08 NOTE — Plan of Care (Signed)
Problem: Ineffective individual coping Goal: STG: Patient will remain free from self harm Outcome: Progressing Pt has not harmed himself tonight.  He denied thoughts of self-harm/SI.  He verbally contracted for safety.

## 2015-02-08 NOTE — Progress Notes (Signed)
Pt stated that he had a good day and is still working on using his coping skills. He was able to go outside today and is looking forward to going home on Monday.

## 2015-02-08 NOTE — Progress Notes (Signed)
[facility] MD Progress Note  02/08/2015 3:38 PM Joe Griffin  MRN:  [id number] Subjective:  I'm sleeping better.  I believe medicine helping me.  Objective Patient seen chart reviewed.  Patient is taking his medication without any side effects.  He endorse less intense paranoia and hallucination.  He is taking Prozac which is helping his depression.  He is still have some time hopelessness and worthlessness but his suicidal thoughts are less intense and less frequent.  His energy level is good.  He is going to the groups however he is still have limited participation.  Patient has no tremors or shakes.  He had blood work and he is negative for HIV, RPR, Chlamydia and GC.  Patient upon discharge like to live with his brother who is been very supportive.  Principal Problem: MDD (major depressive disorder), recurrent, severe, with psychosis Diagnosis:   Patient Active Problem List   Diagnosis Date Noted  . MDD (major depressive disorder), recurrent, severe, with psychosis [F33.3] 02/04/2015   Total Time spent with patient: 30 minutes   Past Medical History:  Past Medical History  Diagnosis Date  . Depression    History reviewed. No pertinent past surgical history. Family History: History reviewed. No pertinent family history. Social History:  History  Alcohol Use No     History  Drug Use Not on file    History   Social History  . Marital Status: Single    Spouse Name: N/A  . Number of Children: N/A  . Years of Education: N/A   Social History Main Topics  . Smoking status: Current Every Day Smoker  . Smokeless tobacco: Not on file  . Alcohol Use: No  . Drug Use: Not on file  . Sexual Activity: Not on file   Other Topics Concern  . None   Social History Narrative   Additional History:    Sleep: Fair  Appetite:  Fair   Assessment:   Musculoskeletal: Strength & Muscle Tone: within normal limits Gait & Station: normal Patient leans: N/A   Psychiatric Specialty  Exam: Physical Exam  Vitals reviewed.   Review of Systems  Constitutional: Negative.  Negative for fever, chills, weight loss and malaise/fatigue.  HENT: Negative.  Negative for congestion, hearing loss and sore throat.   Eyes: Negative.  Negative for blurred vision and double vision.  Respiratory: Negative.  Negative for cough and hemoptysis.   Cardiovascular: Negative for chest pain and palpitations.  Gastrointestinal: Negative.  Negative for heartburn, nausea, vomiting, abdominal pain and constipation.  Genitourinary: Negative.  Negative for dysuria.  Musculoskeletal: Negative.  Negative for myalgias and falls.  Skin: Negative.  Negative for rash.  Neurological: Negative.  Negative for dizziness, seizures, loss of consciousness, weakness and headaches.  Endo/Heme/Allergies: Negative.  Negative for environmental allergies.  Psychiatric/Behavioral: Positive for depression and hallucinations. Negative for suicidal ideas, memory loss and substance abuse. The patient is not nervous/anxious and does not have insomnia.     Blood pressure 112/65, pulse 121, temperature 97.8 F (36.6 C), temperature source Oral, resp. rate 16, height 6' (1.829 m), weight 74.844 kg (165 lb).Body mass index is 22.37 kg/(m^2).  General Appearance: Casual  Eye Contact::  Fair  Speech:  Clear and Coherent and Normal Rate  Volume:  Decreased  Mood:  Depressed and Dysphoric  Affect:  Non-Congruent and Inappropriate  Thought Process:  Coherent and Disorganized  Orientation:  Full (Time, Place, and Person)  Thought Content:  Hallucinations: Auditory  Suicidal Thoughts:  No  Homicidal Thoughts:  No  Memory:  Immediate;   Fair Recent;   Fair Remote;   Fair  Judgement:  Fair  Insight:  Fair  Psychomotor Activity:  Normal  Concentration:  Fair  Recall:  [employer] of Knowledge:Fair  Language: Fair  Akathisia:  No  Handed:  Right  AIMS (if indicated):     Assets:  Desire for Improvement Social Support   ADL's:  Impaired  Cognition: Impaired,  Mild  Sleep:  Number of Hours: 6.75     Current Medications: Current Facility-Administered Medications  Medication Dose Route Frequency Provider Last Rate Last Dose  . acetaminophen (TYLENOL) tablet 650 mg  650 mg Oral Q6H PRN Thermon Leyland, NP      . alum & mag hydroxide-simeth (MAALOX/MYLANTA) 200-200-20 MG/5ML suspension 30 mL  30 mL Oral Q4H PRN Thermon Leyland, NP      . benztropine (COGENTIN) tablet 0.5 mg  0.5 mg Oral BID Beau Fanny, FNP   0.5 mg at 02/08/15 0847  . feeding supplement (ENSURE ENLIVE) (ENSURE ENLIVE) liquid 237 mL  237 mL Oral q morning - 10a Anderson Malta [place], RD   237 mL at 02/07/15 1031  . FLUoxetine (PROZAC) capsule 20 mg  20 mg Oral Daily Sanjuana Kava, NP   20 mg at 02/08/15 0847  . hydrOXYzine (ATARAX/VISTARIL) tablet 25 mg  25 mg Oral Q6H PRN Thermon Leyland, NP   25 mg at 02/06/15 1545  . LORazepam (ATIVAN) tablet 1 mg  1 mg Oral Q6H PRN Adonis Brook, NP      . magnesium hydroxide (MILK OF MAGNESIA) suspension 30 mL  30 mL Oral Daily PRN Thermon Leyland, NP      . nicotine (NICODERM CQ - dosed in mg/24 hours) patch 21 mg  21 mg Transdermal Daily Craige Cotta, MD   21 mg at 02/08/15 0847  . OLANZapine zydis (ZYPREXA) disintegrating tablet 5 mg  5 mg Oral Q8H PRN Thermon Leyland, NP      . risperiDONE (RISPERDAL) tablet 1 mg  1 mg Oral QAC breakfast Adonis Brook, NP   1 mg at 02/08/15 0630  . risperiDONE (RISPERDAL) tablet 2 mg  2 mg Oral QHS Adonis Brook, NP   2 mg at 02/07/15 2136  . traZODone (DESYREL) tablet 50 mg  50 mg Oral QHS PRN Thermon Leyland, NP   50 mg at 02/07/15 2136    Lab Results:  Results for orders placed or performed during the hospital encounter of 02/04/15 (from the past 48 hour(s))  RPR     Status: None   Collection Time: 02/06/15  7:07 PM  Result Value Ref Range   RPR Ser Ql Non Reactive Non Reactive    Comment: (NOTE) Performed At: [facility] [address] [place], [place]  [id number] Mila Homer MD FA:[id number] Performed at [facility]   HIV antibody     Status: None   Collection Time: 02/08/15  6:30 AM  Result Value Ref Range   HIV Screen 4th Generation wRfx Non Reactive Non Reactive    Comment: (NOTE) Performed At: [facility] [address] [place], [place] [id number] Mila Homer MD [id number] Performed at [facility]     Physical Findings: AIMS: Facial and Oral Movements Muscles of Facial Expression: None, normal Lips and Perioral Area: None, normal Jaw: None, normal Tongue: None, normal,Extremity Movements Upper (arms, wrists, hands, fingers): None, normal Lower (legs, knees, ankles, toes): None,  normal, Trunk Movements Neck, shoulders, hips: None, normal, Overall Severity Severity of abnormal movements (highest score from questions above): None, normal Incapacitation due to abnormal movements: None, normal Patient's awareness of abnormal movements (rate only patient's report): No Awareness, Dental Status Current problems with teeth and/or dentures?: No Does patient usually wear dentures?: No  CIWA:    COWS:     Treatment Plan Summary: Daily contact with patient to assess and evaluate symptoms and progress in treatment and Medication management Continue Prozac 20 mg daily for major depressive disorder Changed Risperidone 2 mg pm for psychosis, On Sunday, plan to modify order to 3 mg QHS and plan for discharge on Monday. Continue Cogentin 0.5 mg twice daily for EPS Continue nicotine patch for smoking cessation. Discussed smoking cessation in length with patient and patient states that he's not willing to stop smoking and does not want any treatment for it Continue Vistaril 25 mg every 6 hours when necessary anxiety or agitation Continue trazodone 50 mg at bedtime as needed for sleep Continue to participate in groups and therapeutic milieu  Margit Batte T., MD

## 2015-02-08 NOTE — [facility] Group Notes (Signed)
[facility] Group Notes:  (Clinical Social Work)  02/08/2015  11:15-12:00PM  Summary of Progress/Problems:   The main focus of today's process group was to discuss patients' feelings related to being hospitalized, as well as their safety, given the events on the unit with 6 police officers called in this morning.    The patient expressed his primary feeling about being hospitalized is "better" since things on the hall had calmed down.  He feels that the medication is helping, particularly with voices he was hearing.  He showed good humor, and was open to the suggestion that he not respond to another angry patient with his own irritation.  Type of Therapy:  Group Therapy - Process  Participation Level:  Active  Participation Quality:  Appropriate, Attentive, Sharing and Supportive  Affect:  Appropriate  Cognitive:  Alert  Insight:  Developing/Improving  Engagement in Therapy:  Engaged  Modes of Intervention:  Exploration, Discussion  Ambrose Mantle, LCSW 02/08/2015, 12:13 PM

## 2015-02-09 MED ORDER — RISPERIDONE 3 MG PO TABS
3.0000 mg | ORAL_TABLET | Freq: Every day | ORAL | Status: DC
  Administered 2015-02-09: 3 mg via ORAL
  Filled 2015-02-09 (×3): qty 1

## 2015-02-09 NOTE — Progress Notes (Signed)
[facility] MD Progress Note  02/09/2015 12:00 PM Joe Griffin  MRN:  [id number] Subjective:  I am getting better.  I sleep better last night.    Objective Patient seen chart reviewed.  Patient compliant with the medication and denies any side effects.  He endorses paranoia and hallucinations are less intense.  He slept last night but better.  He still have some time hopelessness and worthlessness but denies any active suicidal thoughts.  He appears anxious and there are times he has some thought blocking.  He is going to the groups.  He is hoping to live with his brother upon discharge.  We discussed blood work results with him.    Principal Problem: MDD (major depressive disorder), recurrent, severe, with psychosis Diagnosis:   Patient Active Problem List   Diagnosis Date Noted  . MDD (major depressive disorder), recurrent, severe, with psychosis [F33.3] 02/04/2015   Total Time spent with patient: 30 minutes   Past Medical History:  Past Medical History  Diagnosis Date  . Depression    History reviewed. No pertinent past surgical history. Family History: History reviewed. No pertinent family history. Social History:  History  Alcohol Use No     History  Drug Use Not on file    History   Social History  . Marital Status: Single    Spouse Name: N/A  . Number of Children: N/A  . Years of Education: N/A   Social History Main Topics  . Smoking status: Current Every Day Smoker  . Smokeless tobacco: Not on file  . Alcohol Use: No  . Drug Use: Not on file  . Sexual Activity: Not on file   Other Topics Concern  . None   Social History Narrative   Additional History:    Sleep: Fair  Appetite:  Fair   Assessment:   Musculoskeletal: Strength & Muscle Tone: within normal limits Gait & Station: normal Patient leans: N/A   Psychiatric Specialty Exam: Physical Exam  Vitals reviewed.   Review of Systems  Constitutional: Negative.  Negative for fever, chills, weight  loss and malaise/fatigue.  HENT: Negative.  Negative for congestion, hearing loss and sore throat.   Eyes: Negative.  Negative for blurred vision and double vision.  Respiratory: Negative.  Negative for cough and hemoptysis.   Cardiovascular: Negative for chest pain and palpitations.  Gastrointestinal: Negative.  Negative for heartburn, nausea, vomiting, abdominal pain and constipation.  Genitourinary: Negative.  Negative for dysuria.  Musculoskeletal: Negative.  Negative for myalgias and falls.  Skin: Negative.  Negative for rash.  Neurological: Negative.  Negative for dizziness, seizures, loss of consciousness, weakness and headaches.  Endo/Heme/Allergies: Negative.  Negative for environmental allergies.  Psychiatric/Behavioral: Positive for depression and hallucinations. Negative for suicidal ideas, memory loss and substance abuse. The patient is not nervous/anxious and does not have insomnia.     Blood pressure 110/71, pulse 109, temperature 97.5 F (36.4 C), temperature source Oral, resp. rate 16, height 6' (1.829 m), weight 74.844 kg (165 lb).Body mass index is 22.37 kg/(m^2).  General Appearance: Casual  Eye Contact::  Fair  Speech:  Clear and Coherent and Normal Rate  Volume:  Decreased  Mood:  Anxious  Affect:  Appropriate  Thought Process:  Coherent and Disorganized  Orientation:  Full (Time, Place, and Person)  Thought Content:  Hallucinations: Auditory  Suicidal Thoughts:  No  Homicidal Thoughts:  No  Memory:  Immediate;   Fair Recent;   Fair Remote;   Fair  Judgement:  Fair  Insight:  Fair  Psychomotor Activity:  Normal  Concentration:  Fair  Recall:  [employer] of Knowledge:Fair  Language: Fair  Akathisia:  No  Handed:  Right  AIMS (if indicated):     Assets:  Desire for Improvement Social Support  ADL's:  Impaired  Cognition: Impaired,  Mild  Sleep:  Number of Hours: 6.75     Current Medications: Current Facility-Administered Medications  Medication  Dose Route Frequency Provider Last Rate Last Dose  . acetaminophen (TYLENOL) tablet 650 mg  650 mg Oral Q6H PRN Thermon Leyland, NP      . alum & mag hydroxide-simeth (MAALOX/MYLANTA) 200-200-20 MG/5ML suspension 30 mL  30 mL Oral Q4H PRN Thermon Leyland, NP      . benztropine (COGENTIN) tablet 0.5 mg  0.5 mg Oral BID Beau Fanny, FNP   0.5 mg at 02/09/15 0753  . feeding supplement (ENSURE ENLIVE) (ENSURE ENLIVE) liquid 237 mL  237 mL Oral q morning - 10a Anderson [place], RD   237 mL at 02/09/15 0933  . FLUoxetine (PROZAC) capsule 20 mg  20 mg Oral Daily Sanjuana Kava, NP   20 mg at 02/09/15 0753  . hydrOXYzine (ATARAX/VISTARIL) tablet 25 mg  25 mg Oral Q6H PRN Thermon Leyland, NP   25 mg at 02/06/15 1545  . LORazepam (ATIVAN) tablet 1 mg  1 mg Oral Q6H PRN Adonis Brook, NP      . magnesium hydroxide (MILK OF MAGNESIA) suspension 30 mL  30 mL Oral Daily PRN Thermon Leyland, NP      . nicotine (NICODERM CQ - dosed in mg/24 hours) patch 21 mg  21 mg Transdermal Daily Craige Cotta, MD   21 mg at 02/09/15 0816  . OLANZapine zydis (ZYPREXA) disintegrating tablet 5 mg  5 mg Oral Q8H PRN Thermon Leyland, NP      . risperiDONE (RISPERDAL) tablet 1 mg  1 mg Oral QAC breakfast Adonis Brook, NP   1 mg at 02/09/15 [phone]  . risperiDONE (RISPERDAL) tablet 2 mg  2 mg Oral QHS Adonis Brook, NP   2 mg at 02/08/15 2106  . traZODone (DESYREL) tablet 50 mg  50 mg Oral QHS PRN Thermon Leyland, NP   50 mg at 02/08/15 2106    Lab Results:  Results for orders placed or performed during the hospital encounter of 02/04/15 (from the past 48 hour(s))  HIV antibody     Status: None   Collection Time: 02/08/15  6:30 AM  Result Value Ref Range   HIV Screen 4th Generation wRfx Non Reactive Non Reactive    Comment: (NOTE) Performed At: [facility] [address] [place], [place] [id number] Mila Homer MD [id number] Performed at [facility]     Physical Findings: AIMS: Facial and  Oral Movements Muscles of Facial Expression: None, normal Lips and Perioral Area: None, normal Jaw: None, normal Tongue: None, normal,Extremity Movements Upper (arms, wrists, hands, fingers): None, normal Lower (legs, knees, ankles, toes): None, normal, Trunk Movements Neck, shoulders, hips: None, normal, Overall Severity Severity of abnormal movements (highest score from questions above): None, normal Incapacitation due to abnormal movements: None, normal Patient's awareness of abnormal movements (rate only patient's report): No Awareness, Dental Status Current problems with teeth and/or dentures?: No Does patient usually wear dentures?: No  CIWA:    COWS:     Treatment Plan Summary: Daily contact with patient to assess and evaluate symptoms and progress in treatment and  Medication management Continue Prozac 20 mg daily for major depressive disorder Increased Risperdal 3 mg QHS, patient used to take Risperdal 3 mg in the past and reported no side effects.  Consider discharge on Monday if no further issues. Continue Cogentin 0.5 mg twice daily for EPS Continue nicotine patch for smoking cessation. Discussed smoking cessation in length with patient and patient states that he's not willing to stop smoking and does not want any treatment for it Continue Vistaril 25 mg every 6 hours when necessary anxiety or agitation Continue trazodone 50 mg at bedtime as needed for sleep Continue to participate in groups and therapeutic milieu  Makala Fetterolf T., MD

## 2015-02-09 NOTE — [facility] Group Notes (Signed)
[facility] Group Notes:  (Nursing/MHT/Case Management/Adjunct)  Date:  02/09/2015  Time:  10:28 AM  Type of Therapy:  Psychoeducational Skills  Participation Level:  Active  Participation Quality:  Appropriate  Affect:  Appropriate  Cognitive:  Appropriate  Insight:  Appropriate  Engagement in Group:  Engaged  Modes of Intervention:  Discussion  Summary of Progress/Problems: Pt did attend self inventory group, pt reported that he was negative SI/HI, no AH/VH noted. Pt rated his depression as a 0, and his helplessness/hopelessness as a 0.     Pt reported no issues or concerns. Noted that his family was his healthy support system.   Jacquelyne Balint Shanta 02/09/2015, 10:28 AM

## 2015-02-09 NOTE — Progress Notes (Signed)
Patient ID: Joe Griffin, male   DOB: [birth year], 29 y.o.   MRN: [id number] D    ---  Pt. Denies pain or dis-comfort at this time.  He is friendly and appropriate with staff and peers.  Pt. Maintains a calm , pleasant affect and agrees to contract for safety.  He spends time in day room with peers and attends groups this AM.  Pt. Takes scheduled medications and shows no adveres reactions.   Pt. States that he is happy to be going home tomorrow.  --- A --  Support  And encouragement provided.  ---  R --  Pt. Remains safe and pleasant on unit

## 2015-02-09 NOTE — [facility] Group Notes (Signed)
[facility] Group Notes:  (Clinical Social Work)  02/09/2015  [facility] Group Notes:  (Clinical Social Work)  02/09/2015  11:00AM-12:00PM  Summary of Progress/Problems:  The main focus of today's process group was to listen to a variety of genres of music and to identify that different types of music provoke different responses.  The patient then was able to identify personally what was soothing for them, as well as energizing.    The patient expressed understanding of concepts, as well as knowledge of how each type of music affected him and how this can be used at home as a wellness/recovery tool.  He was very engaged throughout the entire group, was appropriate at all times.  Type of Therapy:  Music Therapy   Participation Level:  Active  Participation Quality:  Attentive and Sharing and Supportive  Affect:  Appropriate  Cognitive:  Oriented  Insight:  Engaged  Engagement in Therapy:  Engaged  Modes of Intervention:   Activity, Exploration  Ambrose Mantle, LCSW 02/09/2015

## 2015-02-10 MED ORDER — ADULT MULTIVITAMIN W/MINERALS CH: 1.0000 | ORAL_TABLET | Freq: Every day | ORAL | Status: AC

## 2015-02-10 MED ORDER — RISPERIDONE 3 MG PO TABS: 3.0000 mg | ORAL_TABLET | Freq: Every day | ORAL | Status: AC

## 2015-02-10 MED ORDER — FLUOXETINE HCL 20 MG PO CAPS: 20.0000 mg | ORAL_CAPSULE | Freq: Every day | ORAL | Status: AC

## 2015-02-10 MED ORDER — BENZTROPINE MESYLATE 0.5 MG PO TABS: 0.5000 mg | ORAL_TABLET | Freq: Two times a day (BID) | ORAL | Status: AC

## 2015-02-10 MED ORDER — TRAZODONE HCL 50 MG PO TABS: 50.0000 mg | ORAL_TABLET | Freq: Every evening | ORAL | Status: AC | PRN

## 2015-02-10 NOTE — Progress Notes (Signed)
D: Pt at this time is alert and oriented x4. Pt. denies pain or discomfort at this time.  Pt also denies anxiety, depression, and SI/HI/AVH. He states, "I just feel good." Pt continues to be friendly calm and appropriate with staff and peers.   A: Pt attended group accompanied by her interpreter. Medications administered as prescribed.  Support, encouragement, and safe environment provided.  15-minute safety checks continue. R: Pt was med compliant.  Safety checks continue

## 2015-02-10 NOTE — Progress Notes (Signed)
Discharge note: Pt received both written and verbal discharge instructions. Pt verbalized understanding of discharge instructions. Pt agreed to f/u appt and med regimen. Pt received sample meds, prescriptions and belongings. Pt safely left [facility] via transitional of care staff. Pt safely left [facility].

## 2015-02-10 NOTE — Progress Notes (Signed)
Recreation Therapy Notes  Date: 08.01.16 Time: 9:30 am Location: 300 Hall Group Room  Group Topic: Stress Management  Goal Area(s) Addresses:  Patient will verbalize importance of using healthy stress management.  Patient will identify positive emotions associated with healthy stress management.   Intervention: Stress Management  Activity :  Guided Imagery Script.  LRT introduced and educated patients on the stress management technique of guided imagery script.  A script was read and patients were asked to follow a long as the LRT read the script a loud to engaged in the technique of guided imagery.  Education:  Stress Management, Discharge Planning.   Education Outcome: Acknowledges edcuation/In group clarification offered/Needs additional education  Clinical Observations/Feedback: Patient did not attend group.   Ortencia Askari, LRT/CTRS   Debbra Digiulio A 02/10/2015 1:24 PM 

## 2015-02-10 NOTE — Progress Notes (Signed)
  [facility] Adult Case Management Discharge Plan :  Will you be returning to the same living situation after discharge:  Yes,  home with family At discharge, do you have transportation home?: Yes,  Pt provided with bus pass Do you have the ability to pay for your medications: Yes,  Pt provided with prescriptions and supply  Release of information consent forms completed and in the chart;  Patient's signature needed at discharge.  Patient to Follow up at: Follow-up Information    Follow up with [facility].   Why:  Go to the walk-in clinic M-F between 8 and 11AM for your hospital follow up appointment   Contact information:   [address]  [place]  [phone]      Patient denies SI/HI: Yes,  Pt denies    Safety Planning and Suicide Prevention discussed: Yes,  with Pt. Declined family contact  Have you used any form of tobacco in the last 30 days? (Cigarettes, Smokeless Tobacco, Cigars, and/or Pipes): Yes  Has patient been referred to the Quitline?: Patient refused referral  Elaina Hoops 02/10/2015, 12:58 PM
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
## 2015-02-10 NOTE — Tx Team (Addendum)
Interdisciplinary Treatment Plan Update (Adult)  Date:  02/10/2015   Time Reviewed:  1:02 PM   Progress in Treatment: Attending groups: Yes. Participating in groups:  Yes. Taking medication as prescribed:  Yes. Tolerating medication:  Yes. Family/Significant othe contact made:  No Patient understands diagnosis:  Yes  As evidenced by seeking help with psychosis, depression Discussing patient identified problems/goals with staff:  Yes, see initial care plan. Medical problems stabilized or resolved:  Yes. Denies suicidal/homicidal ideation: Yes. Issues/concerns per patient self-inventory:  No. Other:  New problem(s) identified:  Discharge Plan or Barriers:  Return home, follow up outpt  Reason for Continuation of Hospitalization: Depression Hallucinations Medication stabilization  Comments:  Joe Griffin is an 29 y.o. male who presents to [facility] for evaluation of depression and AVH. He reports a history of both and states he was hospitalized at [facility] in 2012 and has not had psychiatric medication or treatment since moving to [place] in may of last year. He reports he's had some auditory hallucinations in the past, but that he could only hear them when he was silent. Now they have gotten to the point where he cannot shut them out. He states that he's only slept 3 hours in the last two days and that he's lost around 14lbs. He reports feeling depressed, anxious, irritable, tired, and endorses isolating behavior, anhedonia, feelings of worthlessness, and increased vegetative symptoms. He is calm, cooperative, and alert and oriented x 4.  Risperdal, Prozac trial  Estimated length of stay: 3-5 days  New goal(s):  Review of initial/current patient goals per problem list:   Review of initial/current patient goals per problem list:  1. Goal(s): Patient will participate in aftercare plan   Met: Yes   Target date: 3-5 days post admission date   As evidenced by: Patient will  participate within aftercare plan AEB aftercare provider and housing plan at discharge being identified.  02/10/2015: Pt plans to return home, follow up outpt.     3. Goal(s): Patient will demonstrate decreased signs and symptoms of anxiety.   Met: No   Target date: 3-5 days post admission date   As evidenced by: Patient will utilize self rating of anxiety at 3 or below and demonstrated decreased signs of anxiety, or be deemed stable for discharge by MD  02/05/2015: Pt rates anxiety at a 5 today. 02/10/15: Pt rates anxiety at 2/10.     5. Goal(s): Patient will demonstrate decreased signs of psychosis  * Met: Yes  * Target date: 3-5 days post admission date  * As evidenced by: Patient will demonstrate decreased frequency of AVH or return to baseline function  02/05/2015: While compliant with meds here, pt has not taken meds for about a year.  Was c/o both AH and VH at admission. 02/10/15: Pt denies AVH and presents with logical and coherent thought process  Attendees: Patient:  02/10/2015 1:02 PM   Family:   02/10/2015 1:02 PM   Physician:  Dr. Tawni Carnes MD 02/10/2015 1:02 PM   Nursing:    02/10/2015 1:02 PM   CSW:    Chad Cordial, LCSW A  02/10/2015 1:02 PM   Other:  02/10/2015 1:02 PM   Other:   02/10/2015 1:02 PM   Other:  Onnie Boer, Nurse CM 02/10/2015 1:02 PM   Other:  Leisa Lenz, [facility] 02/10/2015 1:02 PM   Other:  Tomasita Morrow, P4CC  02/10/2015 1:02 PM   Other:  02/10/2015 1:02 PM   Other:  02/10/2015 1:02 PM  Other:  02/10/2015 1:02 PM   Other:  02/10/2015 1:02 PM   Other:  02/10/2015 1:02 PM   Other:   02/10/2015 1:02 PM    Scribe for Treatment Team:   Peri Maris, Latanya Presser [phone]   02/10/2015 1:02 PM

## 2015-02-10 NOTE — [facility] Suicide Risk Assessment (Signed)
[facility] INPATIENT:  Family/Significant Other Suicide Prevention Education  Suicide Prevention Education:  Education Completed; No one has been identified by the patient as the family member/significant other with whom the patient will be residing, and identified as the person(s) who will aid the patient in the event of a mental health crisis (suicidal ideations/suicide attempt).  With written consent from the patient, the family member/significant other has been provided the following suicide prevention education, prior to the and/or following the discharge of the patient.  The suicide prevention education provided includes the following:  Suicide risk factors  Suicide prevention and interventions  National Suicide Hotline telephone number  [place] assessment telephone number  [facility] Emergency Assistance 911  [facility] and/or Residential Mobile Crisis Unit telephone number  Request made of family/significant other to:  Remove weapons (e.g., guns, rifles, knives), all items previously/currently identified as safety concern.    Remove drugs/medications (over-the-counter, prescriptions, illicit drugs), all items previously/currently identified as a safety concern.  The family member/significant other verbalizes understanding of the suicide prevention education information provided.  The family member/significant other agrees to remove the items of safety concern listed above. The patient did not endorse SI at the time of admission, nor did the patient c/o SI during the stay here.  SPE not required.   Daryel Gerald B 02/10/2015, 8:07 AM

## 2015-02-10 NOTE — [facility] Group Notes (Signed)
[facility] LCSW Aftercare Discharge Planning Group Note  02/10/2015 8:45 AM  Participation Quality: Alert, Appropriate and Oriented  Mood/Affect: Appropriate  Depression Rating: 0  Anxiety Rating: 1  Thoughts of Suicide: Pt denies SI/HI  Will you contract for safety? Yes  Current AVH: Pt denies  Plan for Discharge/Comments: Pt attended discharge planning group and actively participated in group. CSW discussed suicide prevention education with the group and encouraged them to discuss discharge planning and any relevant barriers. Pt reports feeling better and states that he may be moving to [facility] eventually but did not specify when this would happen. He requested a list of providers in the area.  Transportation Means: Pt reports access to transportation  Supports: No supports mentioned at this time  Chad Cordial, LCSWA 02/10/2015 9:50 AM

## 2015-02-10 NOTE — [facility] Suicide Risk Assessment (Signed)
[facility] Discharge Suicide Risk Assessment   Demographic Factors:  Male  Total Time spent with patient: 30 minutes  Musculoskeletal: Strength & Muscle Tone: within normal limits Gait & Station: normal Patient leans: normal  Psychiatric Specialty Exam: Physical Exam  Review of Systems  Constitutional: Negative.   HENT: Negative.   Eyes: Negative.   Respiratory: Negative.   Cardiovascular: Negative.   Gastrointestinal: Negative.   Genitourinary: Negative.   Musculoskeletal: Negative.   Skin: Negative.   Neurological: Negative.   Endo/Heme/Allergies: Negative.   Psychiatric/Behavioral: Positive for depression.    Blood pressure 115/74, pulse 114, temperature 98.7 F (37.1 C), temperature source Oral, resp. rate 18, height 6' (1.829 m), weight 74.844 kg (165 lb).Body mass index is 22.37 kg/(m^2).  General Appearance: Fairly Groomed  Patent attorney::  Fair  Speech:  Clear and Coherent409  Volume:  Normal  Mood:  Euthymic  Affect:  Appropriate  Thought Process:  Coherent and Goal Directed  Orientation:  Full (Time, Place, and Person)  Thought Content:  plans as he moves on  Suicidal Thoughts:  No  Homicidal Thoughts:  No  Memory:  Immediate;   Fair Recent;   Fair Remote;   Fair  Judgement:  Fair  Insight:  Fair  Psychomotor Activity:  Normal  Concentration:  Fair  Recall:  [employer] of Knowledge:Fair  Language: Fair  Akathisia:  No  Handed:  Right  AIMS (if indicated):     Assets:  Desire for Improvement Housing Social Support Vocational/Educational  Sleep:  Number of Hours: 6.75  Cognition: WNL  ADL's:  Intact   Have you used any form of tobacco in the last 30 days? (Cigarettes, Smokeless Tobacco, Cigars, and/or Pipes): Yes  Has this patient used any form of tobacco in the last 30 days? (Cigarettes, Smokeless Tobacco, Cigars, and/or Pipes) Yes, Prescription not provided because: nicotine patches given  Mental Status Per Nursing Assessment::   On Admission:   Thoughts of violence towards others, Plan to harm others  Current Mental Status by Physician: In full contact with reality. There are no active SI plans or intent. States he is back on his medications and he is feeling really well.    Loss Factors: none  Historical Factors: NA  Risk Reduction Factors:   Sense of responsibility to family, Employed, Living with another person, especially a relative and Positive social support  Continued Clinical Symptoms:  Depression:   Severe  Cognitive Features That Contribute To Risk:  None    Suicide Risk:  Minimal: No identifiable suicidal ideation.  Patients presenting with no risk factors but with morbid ruminations; may be classified as minimal risk based on the severity of the depressive symptoms  Principal Problem: MDD (major depressive disorder), recurrent, severe, with psychosis Discharge Diagnoses:  Patient Active Problem List   Diagnosis Date Noted  . MDD (major depressive disorder), recurrent, severe, with psychosis [F33.3] 02/04/2015    Follow-up Information    Follow up with [facility].   Why:  Go to the walk-in clinic M-F between 8 and 11AM for your hospital follow up appointment   Contact information:   [address]  [place]  [phone]      Plan Of Care/Follow-up recommendations:  Activity:  as tolerated Diet:  regular Follow up [facility] as above Is patient on multiple antipsychotic therapies at discharge:  No   Has Patient had three or more failed trials of antipsychotic monotherapy by history:  No  Recommended Plan for Multiple Antipsychotic Therapies:  NA    Damarkus Balis A 02/10/2015, 10:57 AM
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
